# Patient Record
Sex: Male | Born: 1945 | Race: White | Hispanic: No | Marital: Married
Health system: Midwestern US, Academic
[De-identification: ages and names within clinical notes are randomized; demographics above are authoritative.]

---

## 2007-07-06 IMAGING — CR DG CHEST 2V
2 series · 2 of 2 positions shown · non-contrast
Comparison: none

CLINICAL DATA: Preop for torn medial meniscus surgery.
 CHEST - 2 VIEW:

[view not recorded (1 of 2)]
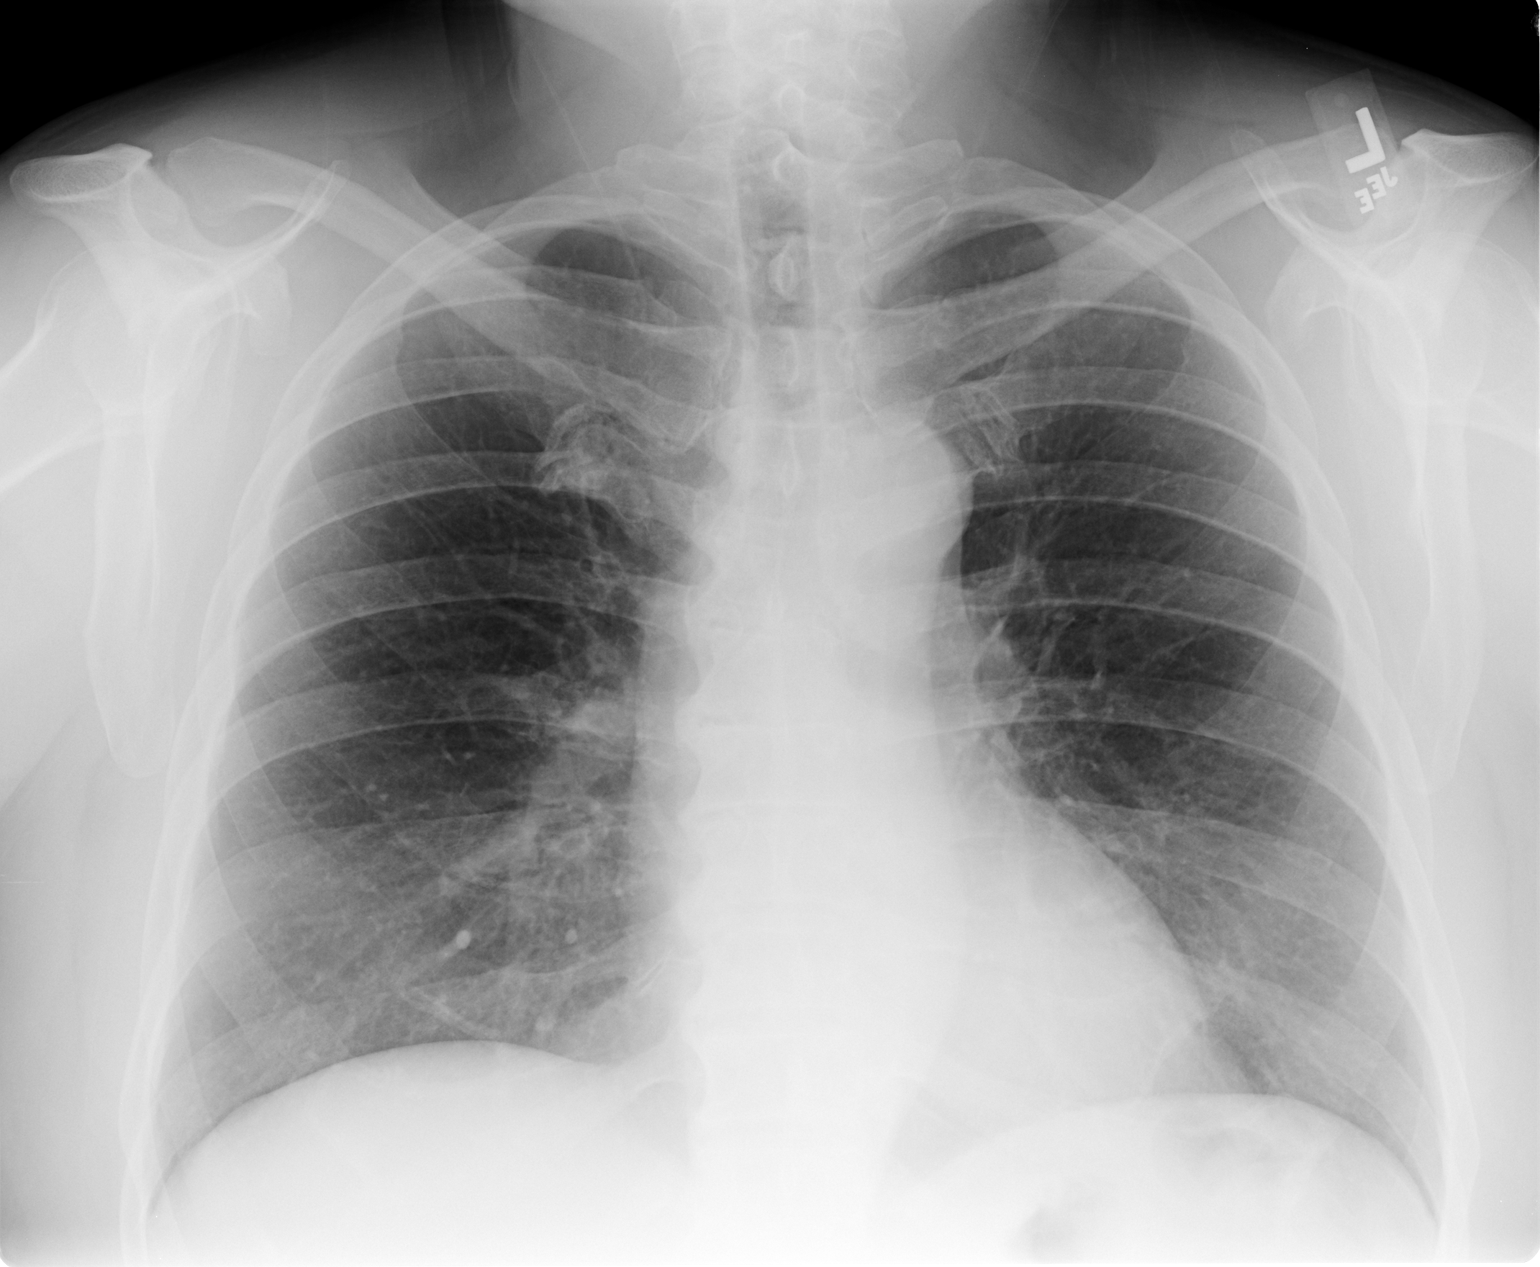

[view not recorded (2 of 2)]
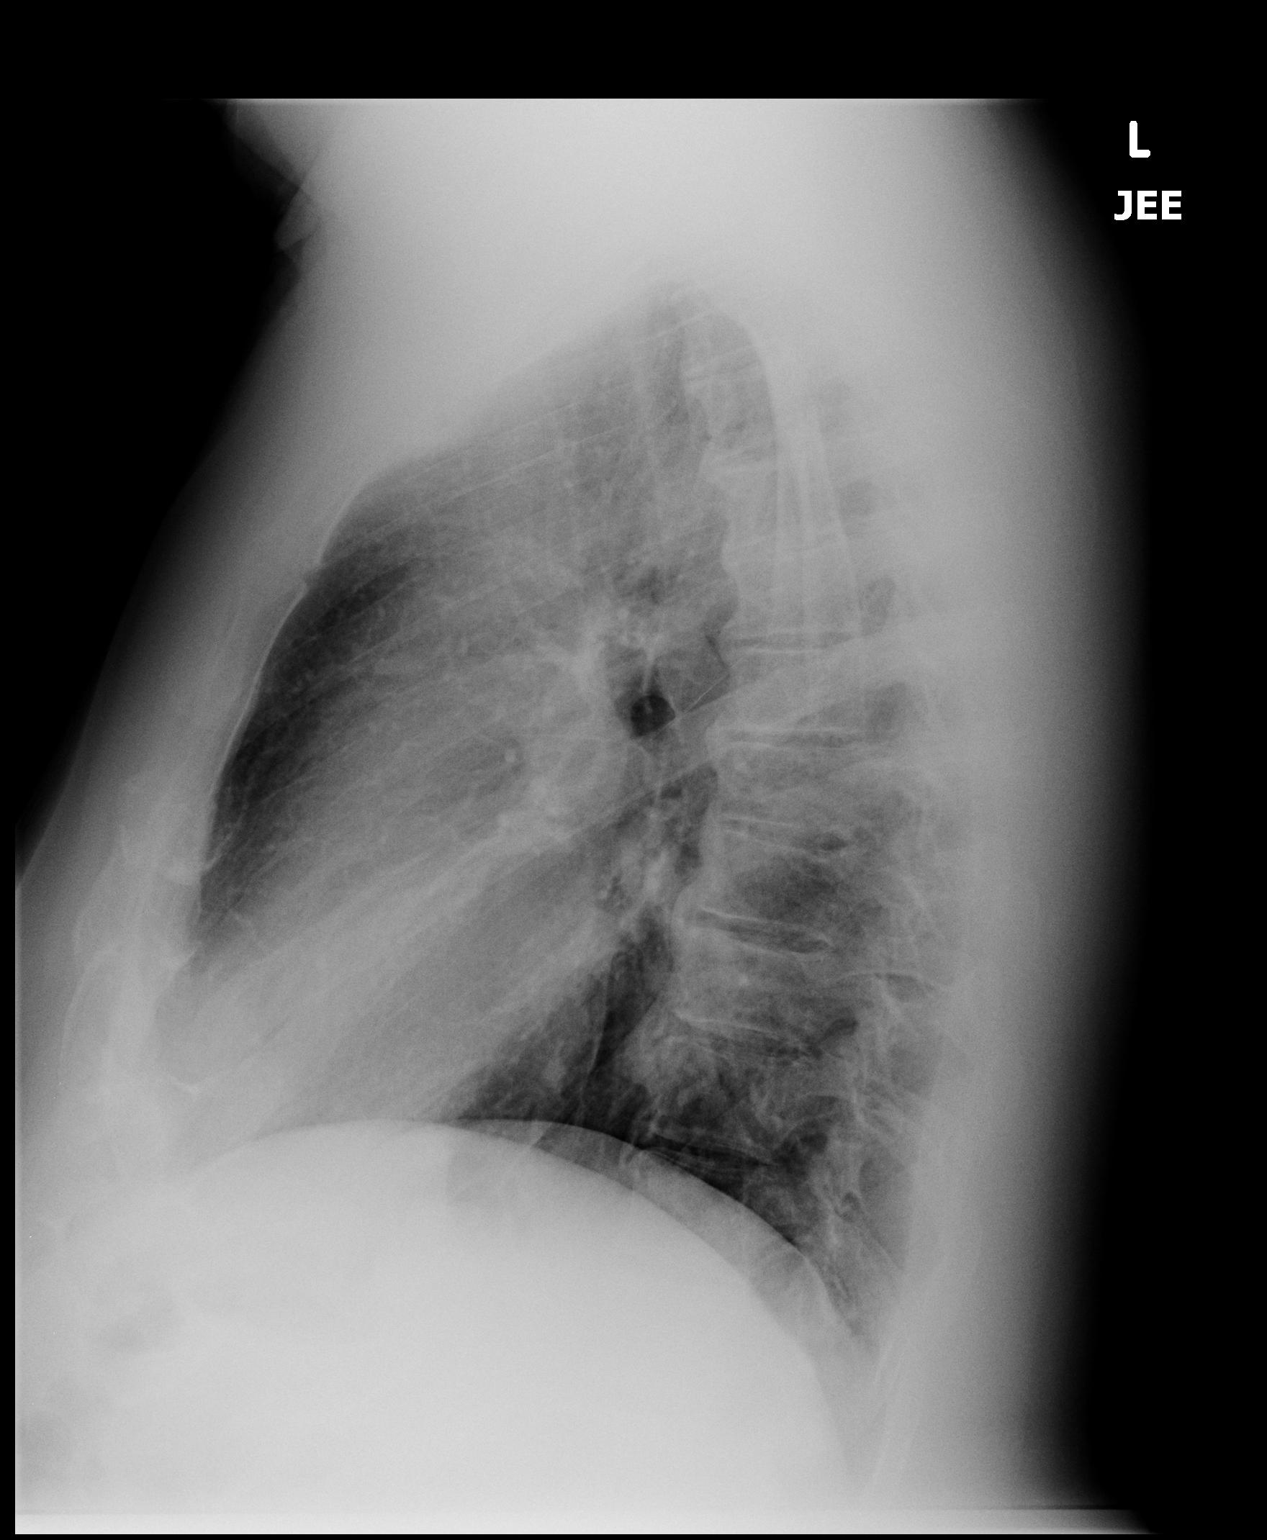

[2 of 2 positions shown; findings below may reference images not displayed]

FINDINGS: Two views of the chest show the lungs to be clear.  The heart is within normal limits in size. There are degenerative changes throughout the thoracic spine.
IMPRESSION: No active lung disease.

## 2022-07-14 ENCOUNTER — Encounter: Admit: 2022-07-14 | Discharge: 2022-07-14

## 2022-07-14 NOTE — Telephone Encounter
Reason for Disposition  ? Patient wants to be seen    Answer Assessment - Initial Assessment Questions  Patient not seen through health system reports received [place] from [facility] but does not have results. Patient requesting to go get [place] and have UC doc read. Reports lump over thyroid and reports grown in size. Denies pain, breathing concerns or difficulty swallowing. Educated patient that provider would not be able to do that at [facility]. Reccommended ER eval for worsening symptoms and for pt to follow up with PCP regarding [place] results. Patient given home care advice and verbalizes understanding. Patient instructed if new, concerning or worsening symptoms occur to seek immediate evaluation at closest ER.       1. ONSET: When did the throat start hurting? (Hours or days ago)       X 1 month worsening over two weeks   2. SEVERITY: How bad is the sore throat? (Scale 1-10; mild, moderate or severe)    - MILD (1-3):  doesn't interfere with eating or normal activities    - MODERATE (4-7): interferes with eating some solids and normal activities    - SEVERE (8-10):  excruciating pain, interferes with most normal activities    - SEVERE DYSPHAGIA: can't swallow liquids, drooling      Denies   3. STREP EXPOSURE: Has there been any exposure to strep within the past week? If Yes, ask: What type of contact occurred?       Denies   4.  VIRAL SYMPTOMS: Are there any symptoms of a cold, such as a runny nose, cough, hoarse voice or red eyes?       Denies   5. FEVER: Do you have a fever? If Yes, ask: What is your temperature, how was it measured, and when did it start?      Denies   6. PUS ON THE TONSILS: Is there pus on the tonsils in the back of your throat?      Denies   7. OTHER SYMPTOMS: Do you have any other symptoms? (e.g., difficulty breathing, headache, rash)      No fevers no SOA no issues swallowing   8. PREGNANCY: Is there any chance you are pregnant? When was your last menstrual period? N/A    Protocols used: Sore Throat-A-[place]

## 2023-01-31 ENCOUNTER — Ambulatory Visit: Admit: 2023-01-31 | Discharge: 2023-01-31 | Payer: MEDICARE

## 2023-02-06 ENCOUNTER — Encounter: Admit: 2023-02-06 | Discharge: 2023-02-06 | Payer: MEDICARE

## 2023-06-19 ENCOUNTER — Encounter: Admit: 2023-06-19 | Discharge: 2023-06-19 | Payer: MEDICARE

## 2023-07-17 ENCOUNTER — Encounter: Admit: 2023-07-17 | Discharge: 2023-07-17 | Payer: MEDICARE

## 2023-09-24 ENCOUNTER — Encounter: Admit: 2023-09-24 | Discharge: 2023-09-24 | Payer: MEDICARE

## 2023-10-23 ENCOUNTER — Encounter: Admit: 2023-10-23 | Discharge: 2023-10-23 | Payer: MEDICARE

## 2023-11-06 ENCOUNTER — Encounter: Admit: 2023-11-06 | Discharge: 2023-11-06 | Payer: MEDICARE

## 2024-02-05 ENCOUNTER — Encounter: Admit: 2024-02-05 | Discharge: 2024-02-05 | Payer: MEDICARE
